# Patient Record
Sex: Female | Born: 1965 | Race: White | Hispanic: No | Marital: Married | Smoking: Never smoker
Health system: Southern US, Community
[De-identification: ages and names within clinical notes are randomized; demographics above are authoritative.]

---

## 2014-08-06 ENCOUNTER — Ambulatory Visit: Payer: Self-pay | Admitting: Family Medicine

## 2017-01-09 ENCOUNTER — Other Ambulatory Visit: Payer: Self-pay | Admitting: Family Medicine

## 2017-01-09 DIAGNOSIS — Z1231 Encounter for screening mammogram for malignant neoplasm of breast: Secondary | ICD-10-CM

## 2017-02-02 ENCOUNTER — Encounter: Payer: Self-pay | Admitting: Radiology

## 2017-02-02 ENCOUNTER — Ambulatory Visit
Admission: RE | Admit: 2017-02-02 | Discharge: 2017-02-02 | Disposition: A | Discharge status: Home / Self Care | Payer: BLUE CROSS/BLUE SHIELD | Source: Ambulatory Visit | Attending: Family Medicine | Admitting: Family Medicine

## 2017-02-02 DIAGNOSIS — Z1231 Encounter for screening mammogram for malignant neoplasm of breast: Secondary | ICD-10-CM | POA: Diagnosis present

## 2017-02-13 ENCOUNTER — Inpatient Hospital Stay
Admission: RE | Admit: 2017-02-13 | Discharge: 2017-02-13 | Disposition: A | Discharge status: Home / Self Care | Payer: Self-pay | Source: Ambulatory Visit | Attending: *Deleted | Admitting: *Deleted

## 2017-02-13 ENCOUNTER — Other Ambulatory Visit: Payer: Self-pay | Admitting: *Deleted

## 2017-02-13 DIAGNOSIS — Z9289 Personal history of other medical treatment: Secondary | ICD-10-CM

## 2018-01-23 ENCOUNTER — Other Ambulatory Visit: Payer: Self-pay | Admitting: Family Medicine

## 2018-01-23 DIAGNOSIS — Z1231 Encounter for screening mammogram for malignant neoplasm of breast: Secondary | ICD-10-CM

## 2018-02-13 ENCOUNTER — Ambulatory Visit
Admission: RE | Admit: 2018-02-13 | Discharge: 2018-02-13 | Disposition: A | Discharge status: Home / Self Care | Payer: BLUE CROSS/BLUE SHIELD | Source: Ambulatory Visit | Attending: Family Medicine | Admitting: Family Medicine

## 2018-02-13 DIAGNOSIS — Z1231 Encounter for screening mammogram for malignant neoplasm of breast: Secondary | ICD-10-CM | POA: Insufficient documentation

## 2019-01-21 ENCOUNTER — Other Ambulatory Visit: Payer: Self-pay | Admitting: Family Medicine

## 2019-01-21 DIAGNOSIS — Z1231 Encounter for screening mammogram for malignant neoplasm of breast: Secondary | ICD-10-CM

## 2019-02-14 ENCOUNTER — Encounter: Payer: Self-pay | Admitting: Radiology

## 2019-02-14 ENCOUNTER — Ambulatory Visit
Admission: RE | Admit: 2019-02-14 | Discharge: 2019-02-14 | Disposition: A | Discharge status: Home / Self Care | Payer: BLUE CROSS/BLUE SHIELD | Source: Ambulatory Visit | Attending: Family Medicine | Admitting: Family Medicine

## 2019-02-14 DIAGNOSIS — Z1231 Encounter for screening mammogram for malignant neoplasm of breast: Secondary | ICD-10-CM | POA: Insufficient documentation

## 2019-02-18 ENCOUNTER — Other Ambulatory Visit: Payer: Self-pay | Admitting: Family Medicine

## 2019-02-18 DIAGNOSIS — R921 Mammographic calcification found on diagnostic imaging of breast: Secondary | ICD-10-CM

## 2019-02-18 DIAGNOSIS — R928 Other abnormal and inconclusive findings on diagnostic imaging of breast: Secondary | ICD-10-CM

## 2019-02-24 ENCOUNTER — Ambulatory Visit
Admission: RE | Admit: 2019-02-24 | Discharge: 2019-02-24 | Disposition: A | Discharge status: Home / Self Care | Payer: BLUE CROSS/BLUE SHIELD | Source: Ambulatory Visit | Attending: Family Medicine | Admitting: Family Medicine

## 2019-02-24 DIAGNOSIS — R921 Mammographic calcification found on diagnostic imaging of breast: Secondary | ICD-10-CM | POA: Diagnosis present

## 2019-02-24 DIAGNOSIS — R928 Other abnormal and inconclusive findings on diagnostic imaging of breast: Secondary | ICD-10-CM | POA: Insufficient documentation

## 2019-02-25 ENCOUNTER — Other Ambulatory Visit: Payer: Self-pay | Admitting: Family Medicine

## 2019-02-25 DIAGNOSIS — R921 Mammographic calcification found on diagnostic imaging of breast: Secondary | ICD-10-CM

## 2019-04-18 DIAGNOSIS — N951 Menopausal and female climacteric states: Secondary | ICD-10-CM | POA: Insufficient documentation

## 2019-10-03 ENCOUNTER — Other Ambulatory Visit: Payer: Self-pay

## 2019-10-03 ENCOUNTER — Ambulatory Visit: Payer: Self-pay

## 2019-10-03 DIAGNOSIS — Z23 Encounter for immunization: Secondary | ICD-10-CM

## 2020-02-12 ENCOUNTER — Ambulatory Visit: Payer: BLUE CROSS/BLUE SHIELD

## 2020-03-16 ENCOUNTER — Other Ambulatory Visit: Payer: Self-pay | Admitting: Family Medicine

## 2020-03-16 DIAGNOSIS — R920 Mammographic microcalcification found on diagnostic imaging of breast: Secondary | ICD-10-CM

## 2020-05-10 ENCOUNTER — Ambulatory Visit
Admission: RE | Admit: 2020-05-10 | Discharge: 2020-05-10 | Disposition: A | Discharge status: Home / Self Care | Payer: BC Managed Care – PPO | Source: Ambulatory Visit | Attending: Family Medicine | Admitting: Family Medicine

## 2020-05-10 DIAGNOSIS — R920 Mammographic microcalcification found on diagnostic imaging of breast: Secondary | ICD-10-CM | POA: Diagnosis not present

## 2020-06-16 ENCOUNTER — Other Ambulatory Visit: Payer: Self-pay

## 2020-06-16 ENCOUNTER — Ambulatory Visit: Payer: Self-pay

## 2020-06-16 ENCOUNTER — Ambulatory Visit: Payer: BC Managed Care – PPO | Admitting: Family Medicine

## 2020-06-16 ENCOUNTER — Encounter: Payer: Self-pay | Admitting: Family Medicine

## 2020-06-16 DIAGNOSIS — M79671 Pain in right foot: Secondary | ICD-10-CM | POA: Diagnosis not present

## 2020-06-16 NOTE — Progress Notes (Signed)
Office Visit Note   Patient: Penny Burton           Date of Birth: January 14, 1966           MRN: [id number] Visit Date: 06/16/2020 Requested by: [facility] [address] [place],  [place] [address] PCP: [facility]  Subjective: Chief Complaint  Patient presents with  . Right Foot - Pain    Pain top of foot. [facility] Day weekend, when she stepped on uneven pavement. Had bruising around her ankle and up her lower leg initially. That has gone away, but pain has remained in top of foot. Has some tingling anterior ankle.     HPI: She is here with right foot pain.  About a month ago she tripped and fell forward landing on her face.  She sustained an abrasion on her chin, and on her hands and she substantially bruised her right lower leg and foot.  She was able to get up and walk with a limp.  She did not seek immediate treatment.  The next week she went to look at colleges with her daughter and did okay, but was still limping quite a bit.  The bruising has resolved and she is not having any pain in her shin area, but she continues to have pain on the dorsum of her foot as well as occasional tingling sensation in front of her ankle.  She has had bad ankle sprains in the past which felt different than this.  She is otherwise in good health.                ROS:   All other systems were reviewed and are negative.  Objective: Vital Signs: There were no vitals taken for this visit.  Physical Exam:  General:  Alert and oriented, in no acute distress. Pulm:  Breathing unlabored. Psy:  Normal mood, congruent affect. Skin: There is no bruising.  She has slight soft tissue swelling dorsally over the distal second and third metatarsals. Right foot: There is no tenderness around the ankle joint, ligaments feel stable.  2+ dorsalis pedis pulse.  Normal sensation.  She has a little bit of pain with extension of the toes against resistance.  No pain with eversion of the  foot or plantarflexion against resistance and her strength is normal.  She is tender to palpation at the mid to distal second and third metatarsals.  No crepitation or step-off.    Imaging: XR Foot Complete Right  Result Date: 06/16/2020 X-rays of the right foot reveal normal anatomic alignment, no significant degenerative change, no sign of fracture.   Assessment & Plan: 1. 1 month status post fall with continued right foot pain, possibilities include bone contusion of metatarsals, nerve stretch injury. -Activities as tolerated, using pain as guide. Anticipate that in several weeks she should be pretty much pain-free. If she continues to have pain, we may do 1 more x-ray and if unrevealing, MRI scan.     Procedures: No procedures performed  No notes on file     PMFS History: There are no problems to display for this patient.  History reviewed. No pertinent past medical history.  Family History  Problem Relation Age of Onset  . Breast cancer Maternal Aunt 45    History reviewed. No pertinent surgical history. Social History   Occupational History  . Not on file  Tobacco Use  . Smoking status: Not on file  Substance and Sexual Activity  .  Alcohol use: Not on file  . Drug use: Not on file  . Sexual activity: Not on file

## 2021-04-28 ENCOUNTER — Other Ambulatory Visit: Payer: Self-pay | Admitting: Family Medicine

## 2021-08-16 ENCOUNTER — Other Ambulatory Visit: Payer: Self-pay

## 2021-08-16 DIAGNOSIS — Z1231 Encounter for screening mammogram for malignant neoplasm of breast: Secondary | ICD-10-CM

## 2021-09-02 ENCOUNTER — Ambulatory Visit
Admission: RE | Admit: 2021-09-02 | Discharge: 2021-09-02 | Disposition: A | Discharge status: Home / Self Care | Payer: BC Managed Care – PPO | Source: Ambulatory Visit

## 2021-09-02 ENCOUNTER — Other Ambulatory Visit: Payer: Self-pay

## 2021-09-02 DIAGNOSIS — Z1231 Encounter for screening mammogram for malignant neoplasm of breast: Secondary | ICD-10-CM | POA: Diagnosis present

## 2021-11-07 ENCOUNTER — Other Ambulatory Visit: Payer: Self-pay

## 2021-11-07 DIAGNOSIS — Z1211 Encounter for screening for malignant neoplasm of colon: Secondary | ICD-10-CM

## 2021-11-07 MED ORDER — NA SULFATE-K SULFATE-MG SULF 17.5-3.13-1.6 GM/177ML PO SOLN: 1.0000 | Freq: Once | ORAL | 0 refills | Status: AC

## 2021-11-07 NOTE — Progress Notes (Signed)
Gastroenterology Pre-Procedure Review  Request Date: 11/29/21 Requesting Physician: Dr. Servando Snare  PATIENT REVIEW QUESTIONS: The patient responded to the following health history questions as indicated:    1. Are you having any GI issues? no 2. Do you have a personal history of Polyps? no 3. Do you have a family history of Colon Cancer or Polyps? no 4. Diabetes Mellitus? no 5. Joint replacements in the past 12 months?no 6. Major health problems in the past 3 months?no 7. Any artificial heart valves, MVP, or defibrillator?no    MEDICATIONS & ALLERGIES:    Patient reports the following regarding taking any anticoagulation/antiplatelet therapy:   Plavix, Coumadin, Eliquis, Xarelto, Lovenox, Pradaxa, Brilinta, or Effient? no Aspirin? no  Patient confirms/reports the following medications:  Current Outpatient Medications  Medication Sig Dispense Refill   Ascorbic Acid (VITAMIN C) 1000 MG tablet Take 500 mg by mouth daily. 2 daily     Cholecalciferol (VITAMIN D3 PO) Take by mouth daily.     estradiol (ESTRACE) 0.1 MG/GM vaginal cream INSERT 2 GRAMS VAGINALLY EVERY NIGHT FOR 2 WEEKS THEN USE VAGINALLY 2 TIMES EVERY WEEK THEREAFTER     Multiple Vitamins-Minerals (MULTIVITAMIN GUMMIES ADULT PO) Take by mouth daily.     No current facility-administered medications for this visit.    Patient confirms/reports the following allergies:  No Known Allergies  No orders of the defined types were placed in this encounter.   AUTHORIZATION INFORMATION Primary Insurance: 1D#: Group #:  Secondary Insurance: 1D#: Group #:  SCHEDULE INFORMATION: Date: 11/29/21 Time: Location: [facility]

## 2021-11-29 ENCOUNTER — Ambulatory Visit: Payer: BC Managed Care – PPO | Admitting: Anesthesiology

## 2021-11-29 ENCOUNTER — Encounter: Payer: Self-pay | Admitting: Gastroenterology

## 2021-11-29 ENCOUNTER — Ambulatory Visit
Admission: RE | Admit: 2021-11-29 | Discharge: 2021-11-29 | Disposition: A | Discharge status: Home / Self Care | Payer: BC Managed Care – PPO | Attending: Gastroenterology | Admitting: Gastroenterology

## 2021-11-29 ENCOUNTER — Encounter
Admission: RE | Disposition: A | Discharge status: Home / Self Care | Payer: Self-pay | Source: Home / Self Care | Attending: Gastroenterology

## 2021-11-29 DIAGNOSIS — Z1211 Encounter for screening for malignant neoplasm of colon: Secondary | ICD-10-CM | POA: Diagnosis present

## 2021-11-29 DIAGNOSIS — K640 First degree hemorrhoids: Secondary | ICD-10-CM | POA: Insufficient documentation

## 2021-11-29 HISTORY — PX: COLONOSCOPY WITH PROPOFOL: SHX5780

## 2021-11-29 SURGERY — COLONOSCOPY WITH PROPOFOL
Anesthesia: General

## 2021-11-29 MED ORDER — LIDOCAINE HCL (CARDIAC) PF 100 MG/5ML IV SOSY
PREFILLED_SYRINGE | INTRAVENOUS | Status: DC | PRN
  Administered 2021-11-29: 40 mg via INTRAVENOUS

## 2021-11-29 MED ORDER — SODIUM CHLORIDE 0.9 % IV SOLN: INTRAVENOUS | Status: DC

## 2021-11-29 MED ORDER — PROPOFOL 500 MG/50ML IV EMUL
INTRAVENOUS | Status: DC | PRN
  Administered 2021-11-29: 150 ug/kg/min via INTRAVENOUS

## 2021-11-29 MED ORDER — PROPOFOL 10 MG/ML IV BOLUS
INTRAVENOUS | Status: DC | PRN
  Administered 2021-11-29: 80 mg via INTRAVENOUS

## 2021-11-29 NOTE — Anesthesia Procedure Notes (Signed)
Date/Time: 11/29/2021 10:30 AM Performed by: Stormy Fabian, CRNA Pre-anesthesia Checklist: Patient identified, Emergency Drugs available, Suction available and Patient being monitored Patient Re-evaluated:Patient Re-evaluated prior to induction Oxygen Delivery Method: Nasal cannula Induction Type: IV induction Dental Injury: Teeth and Oropharynx as per pre-operative assessment  Comments: Nasal cannula with etCO2 monitoring

## 2021-11-29 NOTE — Anesthesia Preprocedure Evaluation (Addendum)
Anesthesia Evaluation  Patient identified by MRN, date of birth, ID band Patient awake    Reviewed: Allergy & Precautions, H&P , NPO status , Patient's Chart, lab work & pertinent test results  Airway Mallampati: II  TM Distance: >3 FB Neck ROM: Full    Dental no notable dental hx.    Pulmonary neg pulmonary ROS,    Pulmonary exam normal breath sounds clear to auscultation       Cardiovascular Exercise Tolerance: Good negative cardio ROS Normal cardiovascular exam Rhythm:Regular Rate:Normal     Neuro/Psych negative neurological ROS  negative psych ROS   GI/Hepatic negative GI ROS, Neg liver ROS,   Endo/Other  negative endocrine ROS  Renal/GU negative Renal ROS  negative genitourinary   Musculoskeletal negative musculoskeletal ROS (+)   Abdominal Normal abdominal exam  (+)   Peds negative pediatric ROS (+)  Hematology negative hematology ROS (+)   Anesthesia Other Findings   Reproductive/Obstetrics negative OB ROS                            Anesthesia Physical Anesthesia Plan  ASA: 1  Anesthesia Plan: General   Post-op Pain Management:    Induction: Intravenous  PONV Risk Score and Plan:   Airway Management Planned: Natural Airway and Nasal Cannula  Additional Equipment:   Intra-op Plan:   Post-operative Plan:   Informed Consent: I have reviewed the patients History and Physical, chart, labs and discussed the procedure including the risks, benefits and alternatives for the proposed anesthesia with the patient or authorized representative who has indicated his/her understanding and acceptance.     Dental Advisory Given  Plan Discussed with: Anesthesiologist, CRNA and Surgeon  Anesthesia Plan Comments: (Patient consented for risks of anesthesia including but not limited to:  - adverse reactions to medications - risk of airway placement if required - damage to eyes,  teeth, lips or other oral mucosa - nerve damage due to positioning  - sore throat or hoarseness - Damage to heart, brain, nerves, lungs, other parts of body or loss of life  Patient voiced understanding.)       Anesthesia Quick Evaluation

## 2021-11-29 NOTE — Anesthesia Postprocedure Evaluation (Signed)
Anesthesia Post Note  Patient: Penny Burton  Procedure(s) Performed: COLONOSCOPY WITH PROPOFOL  Patient location during evaluation: PACU Anesthesia Type: General Level of consciousness: awake and alert Pain management: pain level controlled Vital Signs Assessment: post-procedure vital signs reviewed and stable Respiratory status: spontaneous breathing, nonlabored ventilation and respiratory function stable Cardiovascular status: blood pressure returned to baseline and stable Postop Assessment: no apparent nausea or vomiting Anesthetic complications: no   No notable events documented.   Last Vitals:  Vitals:   11/29/21 1050 11/29/21 1100  BP: 111/76 122/80  Pulse: 62 (!) 57  Resp: 16 15  Temp:    SpO2: 100% 100%    Last Pain:  Vitals:   11/29/21 0936  TempSrc: Temporal  PainSc: 0-No pain                 Foye Deer

## 2021-11-29 NOTE — Op Note (Signed)
[facility] Gastroenterology Patient Name: Penny Burton Procedure Date: 11/29/2021 10:26 AM MRN: [id number] Account #: [id number] Date of Birth: [birth year] Admit Type: Outpatient Age: 55 Room: [facility] ENDO ROOM 4 Gender: Female Note Status: Finalized Instrument Name: Prentice Docker [id number] Procedure:             Colonoscopy Indications:           Screening for colorectal malignant neoplasm Providers:             Midge Minium MD, MD Referring MD:          [facility] (Referring MD) Medicines:             Propofol per Anesthesia Complications:         No immediate complications. Procedure:             Pre-Anesthesia Assessment:                        - Prior to the procedure, a History and Physical was                         performed, and patient medications and allergies were                         reviewed. The patient's tolerance of previous                         anesthesia was also reviewed. The risks and benefits                         of the procedure and the sedation options and risks                         were discussed with the patient. All questions were                         answered, and informed consent was obtained. Prior                         Anticoagulants: The patient has taken no previous                         anticoagulant or antiplatelet agents. ASA Grade                         Assessment: II - A patient with mild systemic disease.                         After reviewing the risks and benefits, the patient                         was deemed in satisfactory condition to undergo the                         procedure.                        After obtaining informed consent, the colonoscope was  passed under direct vision. Throughout the procedure,                         the patient's blood pressure, pulse, and oxygen                         saturations were monitored continuously. The                          Colonoscope was introduced through the anus and                         advanced to the the cecum, identified by appendiceal                         orifice and ileocecal valve. The colonoscopy was                         performed without difficulty. The patient tolerated                         the procedure well. The quality of the bowel                         preparation was adequate to identify polyps. Findings:      The perianal and digital rectal examinations were normal.      Non-bleeding internal hemorrhoids were found during retroflexion. The       hemorrhoids were Grade I (internal hemorrhoids that do not prolapse). Impression:            - Non-bleeding internal hemorrhoids.                        - No specimens collected. Recommendation:        - Discharge patient to home.                        - Resume previous diet.                        - Continue present medications.                        - Repeat colonoscopy in 10 years for screening                         purposes. Procedure Code(s):     --- Professional ---                        [phone], Colonoscopy, flexible; diagnostic, including                         collection of specimen(s) by brushing or washing, when                         performed (separate procedure) Diagnosis Code(s):     --- Professional ---                        Z12.11, Encounter for screening for malignant neoplasm  of colon CPT copyright 2019 American Medical Association. All rights reserved. The codes documented in this report are preliminary and upon coder review may  be revised to meet current compliance requirements. Midge Minium MD, MD 11/29/2021 10:47:24 AM This report has been signed electronically. Number of Addenda: 0 Note Initiated On: 11/29/2021 10:26 AM Scope Withdrawal Time: 0 hours 7 minutes 41 seconds  Total Procedure Duration: 0 hours 14 minutes 44 seconds  Estimated Blood Loss:  Estimated blood  loss: none.      [facility]

## 2021-11-29 NOTE — H&P (Signed)
° °  Midge Minium, MD [facility] [address] [place], [place] [address] Phone: [phone] Fax : [phone]  Primary Care Physician:  [facility] Primary Gastroenterologist:  Dr. Servando Snare  Pre-Procedure History & Physical: HPI:  Penny Burton is a 55 y.o. female is here for a screening colonoscopy.   History reviewed. No pertinent past medical history.  Past Surgical History:  Procedure Laterality Date   CESAREAN SECTION     x2    Prior to Admission medications   Medication Sig Start Date End Date Taking? Authorizing Provider  Ascorbic Acid (VITAMIN C) 1000 MG tablet Take 500 mg by mouth daily. 2 daily   Yes [provider]  Cholecalciferol (VITAMIN D3 PO) Take by mouth daily.   Yes [provider]  Multiple Vitamins-Minerals (MULTIVITAMIN GUMMIES ADULT PO) Take by mouth daily.   Yes [provider]  estradiol (ESTRACE) 0.1 MG/GM vaginal cream INSERT 2 GRAMS VAGINALLY EVERY NIGHT FOR 2 WEEKS THEN USE VAGINALLY 2 TIMES EVERY WEEK THEREAFTER 12/30/19   [provider]    Allergies as of 11/07/2021   (No Known Allergies)    Family History  Problem Relation Age of Onset   Breast cancer Maternal Aunt 62    Social History   Socioeconomic History   Marital status: Married    Spouse name: Not on file   Number of children: Not on file   Years of education: Not on file   Highest education level: Not on file  Occupational History   Not on file  Tobacco Use   Smoking status: Never   Smokeless tobacco: Never  Substance and Sexual Activity   Alcohol use: Yes    Comment: occasionally   Drug use: Never   Sexual activity: Yes  Other Topics Concern   Not on file  Social History Narrative   Not on file   Social Determinants of Health   Financial Resource Strain: Not on file  Food Insecurity: Not on file  Transportation Needs: Not on file  Physical Activity: Not on file  Stress: Not on file  Social Connections: Not on  file  Intimate Partner Violence: Not on file    Review of Systems: See HPI, otherwise negative ROS  Physical Exam: BP (!) 126/97    Pulse 91    Temp (!) 96.8 F (36 C) (Temporal)    Resp 16    Ht 5\' 2"  (1.575 m)    Wt 73 kg    SpO2 100%    BMI 29.45 kg/m  General:   Alert,  pleasant and cooperative in NAD Head:  Normocephalic and atraumatic. Neck:  Supple; no masses or thyromegaly. Lungs:  Clear throughout to auscultation.    Heart:  Regular rate and rhythm. Abdomen:  Soft, nontender and nondistended. Normal bowel sounds, without guarding, and without rebound.   Neurologic:  Alert and  oriented x4;  grossly normal neurologically.  Impression/Plan: Penny Burton is now here to undergo a screening colonoscopy.  Risks, benefits, and alternatives regarding colonoscopy have been reviewed with the patient.  Questions have been answered.  All parties agreeable.

## 2021-11-29 NOTE — Transfer of Care (Signed)
Immediate Anesthesia Transfer of Care Note  Patient: Penny Burton  Procedure(s) Performed: Procedure(s): COLONOSCOPY WITH PROPOFOL (N/A)  Patient Location: PACU and Endoscopy Unit  Anesthesia Type:General  Level of Consciousness: sedated  Airway & Oxygen Therapy: Patient Spontanous Breathing and Patient connected to nasal cannula oxygen  Post-op Assessment: Report given to RN and Post -op Vital signs reviewed and stable  Post vital signs: Reviewed and stable  Last Vitals:  Vitals:   11/29/21 0936 11/29/21 1050  BP: (!) 126/97 111/76  Pulse: 91 62  Resp: 16 16  Temp: (!) 36 C   SpO2: 100% 100%    Complications: No apparent anesthesia complications

## 2021-11-30 ENCOUNTER — Encounter: Payer: Self-pay | Admitting: Gastroenterology

## 2022-04-30 IMAGING — MG MM DIGITAL SCREENING BILAT W/ TOMO AND CAD
6 of 10 series · 6 of 30 positions shown · non-contrast
Comparison: Previous exam(s).

CLINICAL DATA: Screening.

EXAM:
DIGITAL SCREENING BILATERAL MAMMOGRAM WITH TOMOSYNTHESIS AND CAD
TECHNIQUE: Bilateral screening digital craniocaudal and mediolateral oblique
mammograms were obtained. Bilateral screening digital breast
tomosynthesis was performed. The images were evaluated with
computer-aided detection.

[R MLO synth-2D (1 of 2)]
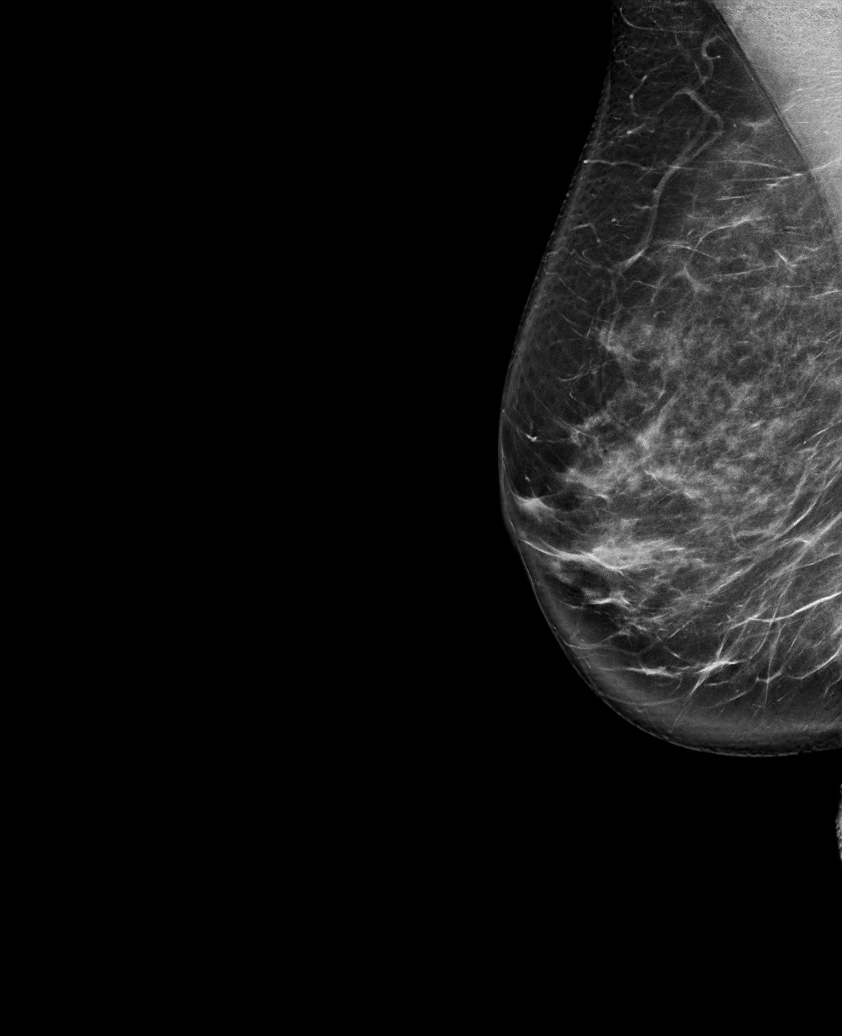

[R CC synth-2D]
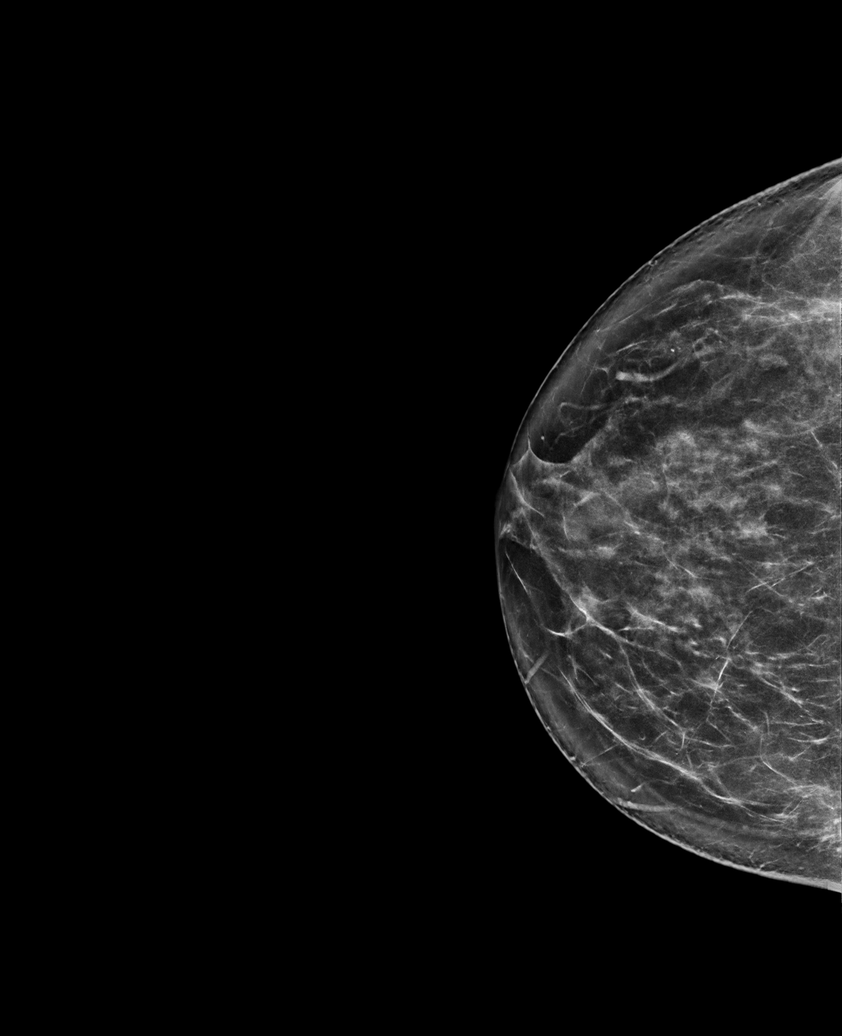

[L CC synth-2D]
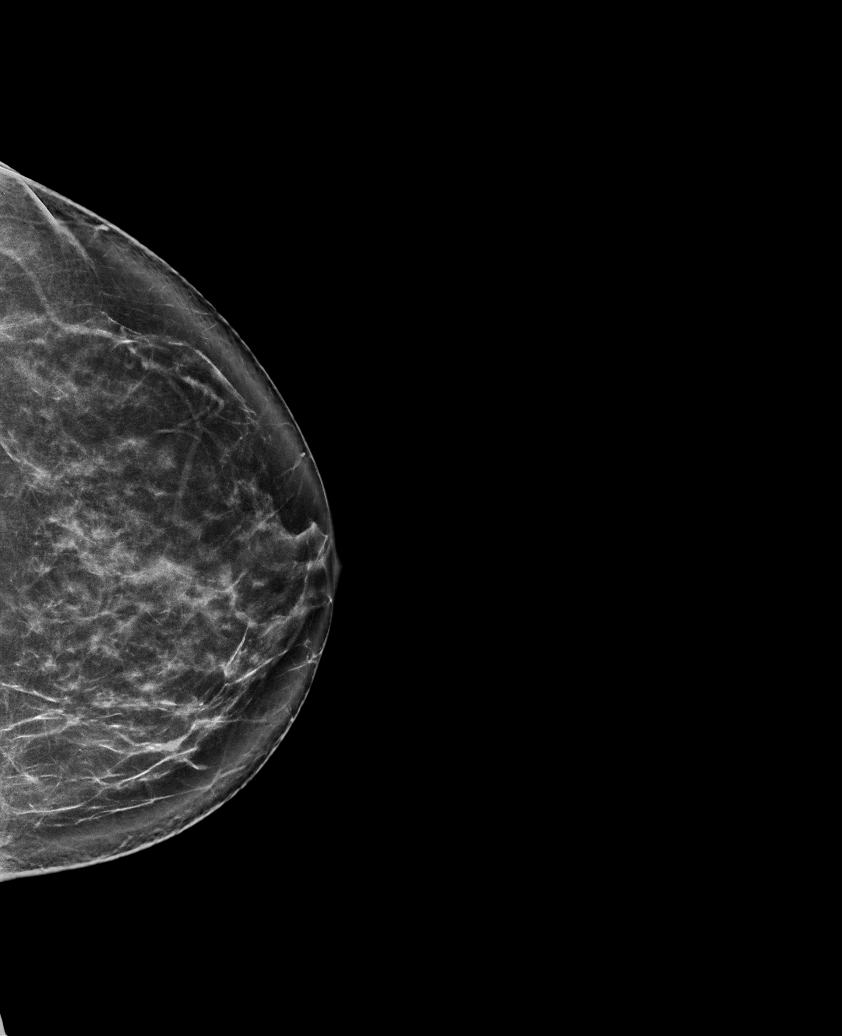

[L MLO synth-2D]
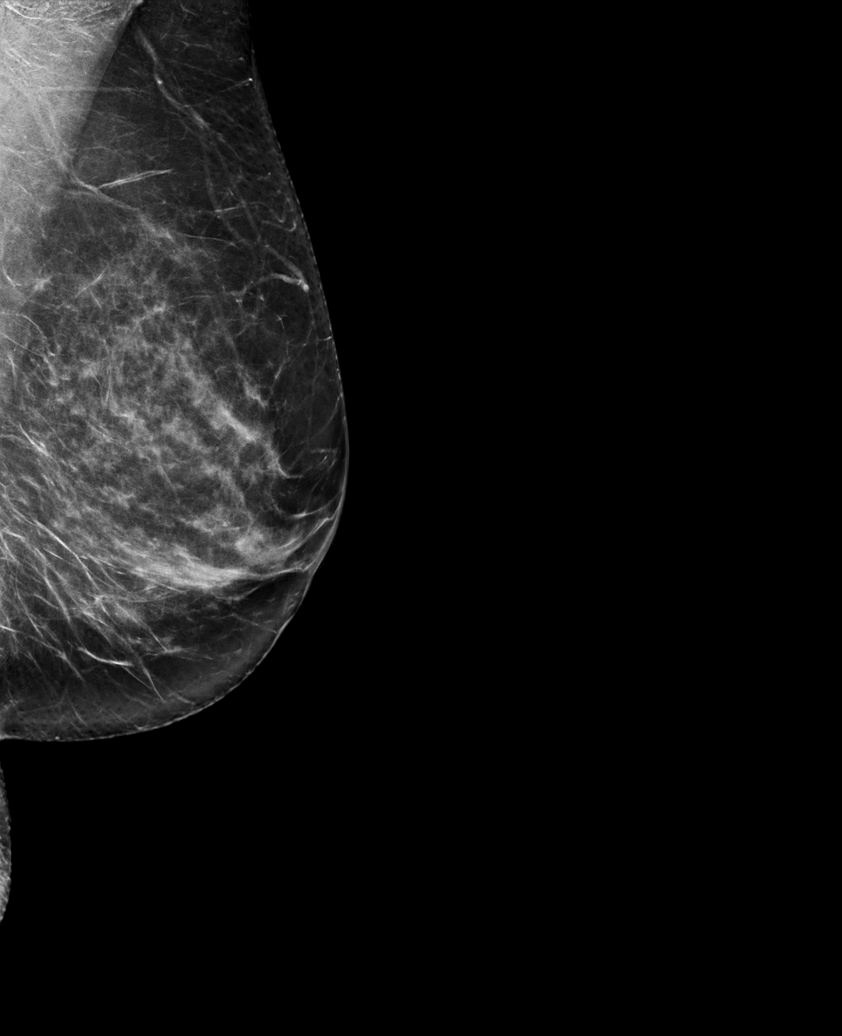

[R MLO synth-2D (2 of 2)]
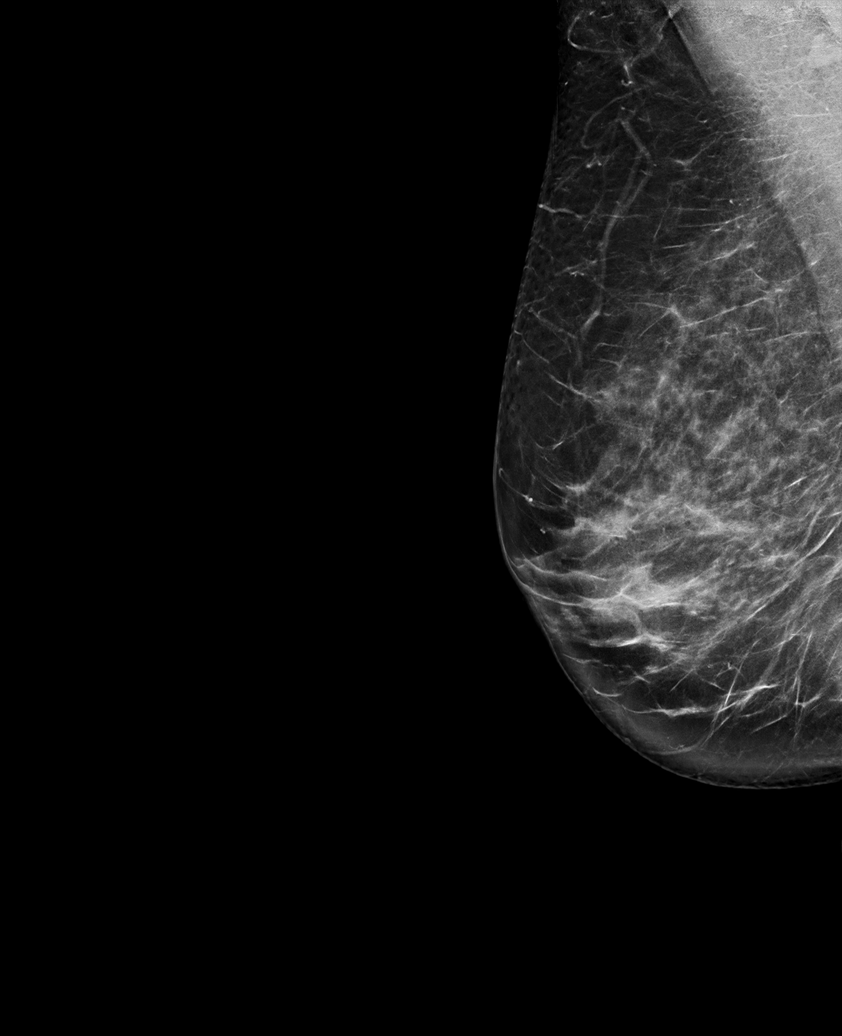

[R MLO tomo · tomo slice 43/86.0]
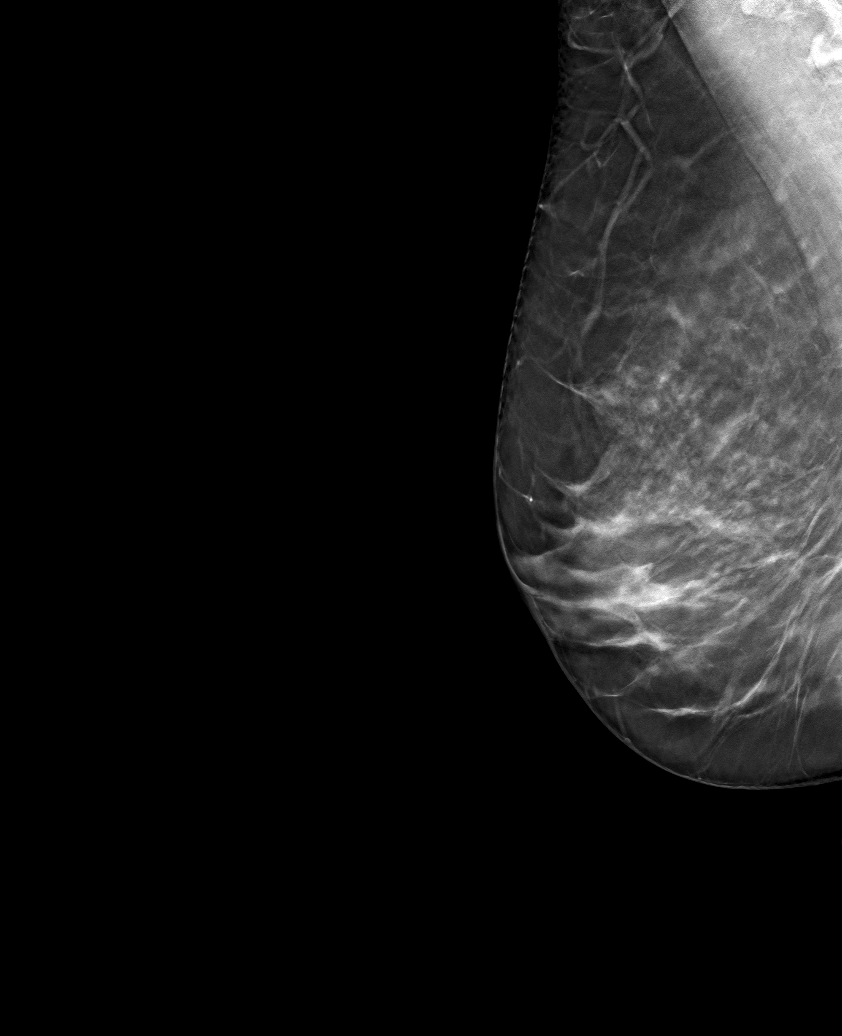

[6 of 30 positions shown; findings below may reference images not displayed]

ACR Breast Density Category c: The breast tissue is heterogeneously
dense, which may obscure small masses.
FINDINGS: There are no findings suspicious for malignancy.
IMPRESSION: No mammographic evidence of malignancy. A result letter of this
screening mammogram will be mailed directly to the patient.

RECOMMENDATION:
Screening mammogram in one year. (Code:[id number])

BI-RADS CATEGORY  1: Negative.

## 2022-11-07 ENCOUNTER — Ambulatory Visit (INDEPENDENT_AMBULATORY_CARE_PROVIDER_SITE_OTHER): Payer: Self-pay | Admitting: Nurse Practitioner

## 2022-11-07 DIAGNOSIS — Z20828 Contact with and (suspected) exposure to other viral communicable diseases: Secondary | ICD-10-CM

## 2022-11-07 MED ORDER — OSELTAMIVIR PHOSPHATE 75 MG PO CAPS
75.0000 mg | ORAL_CAPSULE | Freq: Two times a day (BID) | ORAL | 0 refills | Status: AC
Start: 2022-11-07 — End: 2022-11-12

## 2022-11-07 NOTE — Progress Notes (Signed)
  Virtual Visit Consent   Penny Burton, you are scheduled for a virtual visit with a [facility] provider today. Just as with appointments in the office, your consent must be obtained to participate.   I need to obtain your verbal consent now. Are you willing to proceed with your visit today? Penny Burton has provided verbal consent on 11/07/2022 for a virtual visit (video or telephone). Penny Simas, FNP  Date: 11/07/2022 1:27 PM  Virtual Visit via Video Note   I, Penny Burton, connected with  [name]  ([id number], 1966/12/10) on 11/07/22 at  1:30 PM EST by telephone and verified that I am speaking with the correct person using two identifiers.  Location: Patient: Virtual Visit Location Patient: Home Provider: Virtual Visit Location Provider: Office/Clinic   I discussed the limitations of evaluation and management by telemedicine and the availability of in person appointments. The patient expressed understanding and agreed to proceed.    History of Present Illness: Penny Burton is a 56 y.o. and is being seen today for concern after being exposed to her daughter who was diagnosed with the flu.  She was in the car for 2 hours with her daughter while she was sick  Today patient has a scratchy throat and possible start of symptoms   She has had her annual flu vaccine in October of this year   Problems:  Patient Active Problem List   Diagnosis Date Noted   Colon cancer screening    Vaginal dryness, menopausal 04/18/2019    Allergies: No Known Allergies Medications:  Current Outpatient Medications:    Ascorbic Acid (VITAMIN C) 1000 MG tablet, Take 500 mg by mouth daily. 2 daily, Disp: , Rfl:    Cholecalciferol (VITAMIN D3 PO), Take by mouth daily., Disp: , Rfl:    estradiol (ESTRACE) 0.1 MG/GM vaginal cream, INSERT 2 GRAMS VAGINALLY EVERY NIGHT FOR 2 WEEKS THEN USE VAGINALLY 2 TIMES EVERY WEEK THEREAFTER, Disp: , Rfl:    Multiple Vitamins-Minerals (MULTIVITAMIN GUMMIES ADULT  PO), Take by mouth daily., Disp: , Rfl:   Observations/Objective: No labored breathing.  Speech is clear and coherent with logical content.  Patient is alert and oriented at baseline.    Assessment and Plan: 1. Exposure to the flu Manage symptoms with OTC cold/flu medications Take anti-viral with food and stop if symptoms worsen   - oseltamivir (TAMIFLU) 75 MG capsule; Take 1 capsule (75 mg total) by mouth 2 (two) times daily for 5 days.  Dispense: 10 capsule; Refill: 0     Follow Up Instructions: I discussed the assessment and treatment plan with the patient. The patient was provided an opportunity to ask questions and all were answered. The patient agreed with the plan and demonstrated an understanding of the instructions.    The patient was advised to call back or seek an in-person evaluation if the symptoms worsen or if the condition fails to improve as anticipated.  Time:  I spent 15 minutes with the patient via telehealth technology discussing the above problems/concerns.    Penny Simas, FNP

## 2022-11-17 ENCOUNTER — Emergency Department (HOSPITAL_COMMUNITY): Payer: BC Managed Care – PPO

## 2022-11-17 ENCOUNTER — Emergency Department (HOSPITAL_COMMUNITY)
Admission: EM | Admit: 2022-11-17 | Discharge: 2022-11-17 | Disposition: A | Discharge status: Home / Self Care | Payer: BC Managed Care – PPO | Attending: Emergency Medicine | Admitting: Emergency Medicine

## 2022-11-17 ENCOUNTER — Other Ambulatory Visit: Payer: Self-pay

## 2022-11-17 DIAGNOSIS — R911 Solitary pulmonary nodule: Secondary | ICD-10-CM | POA: Diagnosis not present

## 2022-11-17 DIAGNOSIS — R0602 Shortness of breath: Secondary | ICD-10-CM

## 2022-11-17 DIAGNOSIS — R079 Chest pain, unspecified: Secondary | ICD-10-CM | POA: Diagnosis present

## 2022-11-17 LAB — BASIC METABOLIC PANEL
Anion gap: 6 (ref 5–15)
BUN: 14 mg/dL (ref 6–20)
CO2: 28 mmol/L (ref 22–32)
Calcium: 9.2 mg/dL (ref 8.9–10.3)
Chloride: 107 mmol/L (ref 98–111)
Creatinine, Ser: 0.78 mg/dL (ref 0.44–1.00)
GFR, Estimated: >60 mL/min (ref >60)
Glucose, Bld: 101 mg/dL — ABNORMAL HIGH (ref 70–99)
Potassium: 4.2 mmol/L (ref 3.5–5.1)
Sodium: 141 mmol/L (ref 135–145)

## 2022-11-17 LAB — CBC
HCT: 38.9 % (ref 36.0–46.0)
Hemoglobin: 12.9 g/dL (ref 12.0–15.0)
MCH: 28.5 pg (ref 26.0–34.0)
MCHC: 33.2 g/dL (ref 30.0–36.0)
MCV: 86.1 fL (ref 80.0–100.0)
Platelets: 231 10*3/uL (ref 150–400)
RBC: 4.52 MIL/uL (ref 3.87–5.11)
RDW: 13.2 % (ref 11.5–15.5)
WBC: 5 10*3/uL (ref 4.0–10.5)
nRBC: 0 % (ref 0.0–0.2)

## 2022-11-17 LAB — TROPONIN I (HIGH SENSITIVITY): Troponin I (High Sensitivity): <2 ng/L (ref <18)

## 2022-11-17 NOTE — ED Provider Triage Note (Signed)
Emergency Medicine Provider Triage Evaluation Note  Penny Burton , a 56 y.o. female  was evaluated in triage.  Pt complains of sudden onset of chest pain with shortness of breath over the last 2 to 3 days.  Initially was on the left side of her chest, nonradiating, however the next day felt more pain tenderness in her neck.  Today pain/discomfort has returned to the left central chest.  Occurs with deep inspiration, though not with overall inspiration.  No history of this before, asthma or lung disease, or significant cardiac history.  Works with administrative coordination at the [employer], and has noted increased stress over the last few weeks.  Recently finished taking Tamiflu last week on Thursday due to positive flu test.  Review of Systems  Positive:  Negative: See above  Physical Exam  BP 124/87 (BP Location: Left Arm)   Pulse 73   Temp 98.9 F (37.2 C) (Oral)   Resp 16   Ht 5\' 2"  (1.575 m)   Wt 74.8 kg   SpO2 99%   BMI 30.18 kg/m  Gen:   Awake, no distress   Resp:  Normal effort, CTAB, equal chest rise MSK:   Moves extremities without difficulty  Other:  Sitting comfortably, mild chest wall tenderness of left central chest.  Medical Decision Making  Medically screening exam initiated at 1:19 PM.  Appropriate orders placed.  Ardella Chhim was informed that the remainder of the evaluation will be completed by another provider, this initial triage assessment does not replace that evaluation, and the importance of remaining in the ED until their evaluation is complete.     Conception Chancy, PA-C 11/17/22 1322

## 2022-11-17 NOTE — ED Notes (Signed)
Dr Chilton Si and Blue top sent to lab.

## 2022-11-17 NOTE — Discharge Instructions (Addendum)
Your caregiver has diagnosed you as having chest pain that is nonspecific for one problem.  This means that after looking at you and examining you and ordering tests (such as blood work, chest x-rays, CT scan, and EKG), your caregiver does not believe that the problem is serious enough to need watching in the hospital.  This judgment is often made after testing shows no acute heart attack and you are at low risk for sudden acute heart condition.  Chest pain comes from many different causes.    Seek immediate medical attention if:  You have severe chest pain, especially if the pain is crushing or pressure-like and spreads to the arms, back, neck, or jaw, or if you have sweating, nausea, shortness of breath. This is an emergency. Don't wait to see if the pain will go away. Get medical help at once. Call 911 immediately. Do not drive herself to the hospital.  Your chest pain gets worse and does not go away with rest.  You have an attack of chest pain lasting longer than usual, despite rest and treatment with the medications your caregiver has prescribed  You awaken from sleep with chest pain or shortness of breath.  You feel faint or dizzy  You have chest pain not typical of your usual pain for which you originally saw your caregiver.  You must have a repeat evaluation within 24 hours for a recheck of your heart.  Please call your doctor this morning to schedule this appointment. If you do not have a family doctor, please see the list of doctors below.    Although this is easier said than done, please try to reduce the amount of stress at work or at home, as this may help as well.  Again, return to the ED for new or worsening symptoms as discussed.  RESOURCE GUIDE  Dental Problems  Patients with Medicaid: [facility] [phone]                                           [phone] [address] Phone:  [phone]                                                   Phone:  [phone]  If unable to pay or uninsured, contact:  [facility] or [facility]. to become qualified for the adult dental clinic.  Chronic Pain Problems Contact [facility]  [phone] Patients need to be referred by their primary care doctor.  Insufficient Money for Medicine Contact [employer]:  call "211" or [facility] 9516650814.  No Primary Care Doctor Call [facility]  [phone] Other agencies that provide inexpensive medical care    [facility]  [phone]    [facility]  [phone]    [facility]  [phone]    [facility]  [phone]    [facility]  [phone]    [facility]  [phone]  [facility]ological Services [facility]  [phone] [facility]  [phone] [facility]   800  [phone] (emergency services [phone])  Substance Abuse Resources Alcohol and Drug Services  [place] [phone] [facility] [phone] Residential & Outpatient Substance Abuse Program  [phone]  Abuse/Neglect [place] [phone] [place] [phone] (After Hours)  Emergency [place] [phone]  [place] [facility] [place] [phone] [place] [phone]  MRSA Hotline #:   (609) 028-6905    [facility] Dept. [address]      [place] Phone:  [phone]                                   Phone:  [phone]                 Phone:  [place] Phone:  [place] [phone] (After Hours)

## 2022-11-17 NOTE — ED Provider Notes (Signed)
[facility]-EMERGENCY DEPT Provider Note   CSN: [id number] Arrival date & time: 11/17/22  1156     History  Chief Complaint  Patient presents with   Chest Pain    Penny Burton is a 56 y.o. female with no significant cardiac history presenting to the ED today due to chest pain and shortness of breath.  Began 3 days ago, initially pain on the left side of the chest which was nonradiating.  The next day pain was felt more in the left neck area.  Today pain/discomfort return to the left central chest area with short episode of left hand "tingling" sensation.  Pain occurs with deep inspiration, however absent otherwise.  States this is never happened before.  No Hx of asthma or lung disease.  Works as an Neurosurgeon at [employer], and has reported increased stress over the last few weeks.  Recently finished taking Tamiflu last week on Thursday due to recent URI.  Denies N/V/D, abdominal pain, dizziness, urinary symptoms, or changes in bowel habits.  No known recent sick contacts.  The history is provided by the patient and medical records.  Chest Pain Associated symptoms: shortness of breath       Home Medications Prior to Admission medications   Medication Sig Start Date End Date Taking? Authorizing Provider  Ascorbic Acid (VITAMIN C) 1000 MG tablet Take 500 mg by mouth daily. 2 daily    [provider]  Cholecalciferol (VITAMIN D3 PO) Take by mouth daily.    [provider]  estradiol (ESTRACE) 0.1 MG/GM vaginal cream INSERT 2 GRAMS VAGINALLY EVERY NIGHT FOR 2 WEEKS THEN USE VAGINALLY 2 TIMES EVERY WEEK THEREAFTER 12/30/19   [provider]  Multiple Vitamins-Minerals (MULTIVITAMIN GUMMIES ADULT PO) Take by mouth daily.    [provider]      Allergies    Patient has no known allergies.    Review of Systems   Review of Systems  Respiratory:  Positive for shortness of breath.   Cardiovascular:  Positive  for chest pain.    Physical Exam Updated Vital Signs BP 124/87 (BP Location: Left Arm)   Pulse 73   Temp 98.9 F (37.2 C) (Oral)   Resp 16   Ht 5\' 2"  (1.575 m)   Wt 74.8 kg   SpO2 99%   BMI 30.18 kg/m  Physical Exam Vitals and nursing note reviewed.  Constitutional:      General: She is not in acute distress.    Appearance: She is well-developed. She is not ill-appearing, toxic-appearing or diaphoretic.  HENT:     Head: Normocephalic and atraumatic.  Eyes:     Conjunctiva/sclera: Conjunctivae normal.  Cardiovascular:     Rate and Rhythm: Normal rate and regular rhythm.     Pulses:          Radial pulses are 2+ on the right side and 2+ on the left side.     Heart sounds: No murmur heard. Pulmonary:     Effort: Pulmonary effort is normal. No tachypnea, accessory muscle usage or respiratory distress.     Breath sounds: Normal breath sounds. No stridor. No decreased breath sounds, wheezing, rhonchi or rales.     Comments: CTAB, equal chest rise, able to communicate without difficulty, without increased respiratory effort Chest:     Chest wall: Tenderness (Mild central-left chest wall tenderness) present. No mass, deformity or crepitus.  Abdominal:     Palpations: Abdomen is soft.     Tenderness:  There is no abdominal tenderness.  Musculoskeletal:        General: No swelling.     Cervical back: Neck supple.     Right lower leg: No tenderness. No edema.     Left lower leg: No tenderness. No edema.  Lymphadenopathy:     Cervical: No cervical adenopathy.  Skin:    General: Skin is warm and dry.     Capillary Refill: Capillary refill takes less than 2 seconds.     Coloration: Skin is not cyanotic or pale.  Neurological:     Mental Status: She is alert and oriented to person, place, and time.  Psychiatric:        Mood and Affect: Mood normal.     ED Results / Procedures / Treatments   Labs (all labs ordered are listed, but only abnormal results are displayed) Labs  Reviewed  BASIC METABOLIC PANEL - Abnormal; Notable for the following components:      Result Value   Glucose, Bld 101 (*)    All other components within normal limits  CBC  TROPONIN I (HIGH SENSITIVITY)  TROPONIN I (HIGH SENSITIVITY)    EKG EKG Interpretation  Date/Time:  Friday November 17 2022 12:09:41 EST Ventricular Rate:  73 PR Interval:  166 QRS Duration: 76 QT Interval:  382 QTC Calculation: 420 R Axis:   71 Text Interpretation: Normal sinus rhythm Normal ECG No previous ECGs available Confirmed by Jacalyn Lefevre [phone]) on 11/17/2022 3:34:55 PM  Radiology CT Chest Wo Contrast  Result Date: 11/17/2022 CLINICAL DATA:  Lung nodule EXAM: CT CHEST WITHOUT CONTRAST TECHNIQUE: Multidetector CT imaging of the chest was performed following the standard protocol without IV contrast. RADIATION DOSE REDUCTION: This exam was performed according to the departmental dose-optimization program which includes automated exposure control, adjustment of the mA and/or kV according to patient size and/or use of iterative reconstruction technique. COMPARISON:  None Available. FINDINGS: Cardiovascular: Normal heart size. No pericardial effusion. Normal caliber thoracic aorta with no atherosclerotic disease. Mediastinum/Nodes: Small hiatal hernia. Thyroid is unremarkable. No pathologically enlarged lymph nodes seen in the chest. Lungs/Pleura: Central airways are patent. No consolidation, pleural effusion or pneumothorax. Well-circumscribed nodule with central calcifications and macroscopic fat measuring 1.5 x 1.5 cm located on series 5 image 95. Upper Abdomen: No acute abnormality. Musculoskeletal: No chest wall mass or suspicious bone lesions identified. IMPRESSION: 1. 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 2. No acute findings in the chest. Electronically Signed   By: Allegra Lai M.D.   On: 11/17/2022 16:08   DG Chest 2  View  Result Date: 11/17/2022 CLINICAL DATA:  Chest pain EXAM: CHEST - 2 VIEW COMPARISON:  None Available. FINDINGS: Heart size and mediastinal contours are within normal limits. Solid pulmonary nodule of the right middle lobe. Both lungs are otherwise clear. The visualized skeletal structures are unremarkable. IMPRESSION: Solid pulmonary nodule of the right middle lobe. Chest CT is recommended for further evaluation. Electronically Signed   By: Allegra Lai M.D.   On: 11/17/2022 13:29    Procedures Procedures    Medications Ordered in ED Medications - No data to display  ED Course/ Medical Decision Making/ A&P           HEART Score: 1                Medical Decision Making Amount and/or Complexity of Data Reviewed Labs: ordered. Radiology: ordered.   56 y.o. female presents to  the ED for concern of Chest Pain     This involves an extensive number of treatment options, and is a complaint that carries with it a high risk of complications and morbidity.  The emergent differential diagnosis prior to evaluation includes, but is not limited to: ACS, pneumonia, pneumothorax, pulmonary embolism,pericarditis/myocarditis, GERD, PUD, musculoskeletal, costochondritis, anxiousness  This is not an exhaustive differential.   Past Medical History / Co-morbidities / Social History: Hx of post-menopause. Social Determinants of Health include: None  Additional History:  None  Lab Tests: I ordered, and personally interpreted labs.  The pertinent results include:   CBC without elevated WBC, leukocytosis, or evidence of anemia BMP unremarkable Troponin less than 2  Imaging Studies: I ordered imaging studies including CXR, CT chest.   I independently visualized and interpreted imaging.  CXR indicates a solid pulmonary nodule of the right middle lobe, recommended CT chest for further evaluation.  Further evaluation with CT chest indicates a benign pulmonary nodule, likely hamartoma.  No  evidence of rib fracture, pneumonia, or other acute findings. I agree with the radiologist interpretation.  Cardiac Monitoring: The patient was maintained on a cardiac monitor.  I personally viewed and interpreted the cardiac monitored which showed an underlying rhythm of: NSR I personally ordered and interpreted EKG 12 lead findings, which showed: NSR with evidence of infarction or ischemia  ED Course / Critical Interventions: Pt well-appearing on exam.  Nontoxic, nonseptic appearing in NAD.  Presenting to the ED due to 2 to 3 days of waxing and waning chest pain and shortness of breath, both worsened with deep inspiration.  Pain does not radiate.  Patient denies chest pain or shortness of breath at this time, stating as long as she takes regular or shallow breaths she does not feel the symptoms.  No significant cardiac hx.  No Hx of asthma or COPD.  This is never happened before per patient.  Also with increased stress at work over the last few weeks. HEART score of 1 due to age, low risk.  Considered ASA and nitroglycerin, however do not seem appropriate at this time.  Well's score 0, low probability.  Labs unremarkable.  No cough, no leukocytosis, no fevers.  Unlikely pneumothorax or pneumonia.  Unlikely pericarditis/myocarditis, GERD, PUD, as does not fit clinical picture.  Chest pain not exertional, appears pleuritic in nature.  CXR with findings suspicious for pulmonary nodule, recommended further evaluation with CT.  No evidence of pleural effusion or pulmonary edema on CXR.  Not reproducible with ROM, less likely MSK related.  Unlikely dissection, no pulse deficit, no tearing chest pain, no neurologic complaints.  EKG findings indicate without evidence of acute ischemic changes, abnormal intervals, or dysrhythmia.  Troponins negative.  Presentation provides low suspicion for ACS, however plan to proceed with further imaging to evaluate pulmonary nodule. Upon reevaluation, status unchanged.   Discussed CXR findings and recommendations for CT.  Patient in agreement.  Patient reports she has been postmenopausal for at least 5 years, has elected to decline pregnancy testing prior to further imaging and understands associated risks.  CT pending. CT indicates a likely hamartoma without any other acute findings, likely benign.  Overall, I am uncertain the exact etiology of the patient's symptoms.  However, I do not believe she is currently experiencing a medical, surgical, or psychiatric emergency.  Recommend close follow-up with PCP for reevaluation and continued medical management.  Also recommend finding ways to destress over the next few weeks and to monitor for improvement.  Strict return precautions discussed.  Patient reports satisfaction with today's encounter.  Patient in NAD and good condition at time of discharge.  Disposition: After consideration the patient's encounter today, I do not feel today's workup suggests an emergent condition requiring admission or immediate intervention beyond what has been performed at this time.  Safe for discharge; instructed to return immediately for worsening symptoms, change in symptoms or any other concerns.  I have reviewed the patients home medicines and have made adjustments as needed.  Discussed course of treatment with the patient, whom demonstrated understanding.  Patient in agreement and has no further questions.     I discussed this case with attending, Dr. Particia Nearing, who agreed with the proposed treatment course and cosigned this note.  Attending physician stated agreement with plan or made changes to plan which were implemented.     This chart was dictated using voice recognition software.  Despite best efforts to proofread, errors can occur which can change the documentation meaning.         Final Clinical Impression(s) / ED Diagnoses Final diagnoses:  Nonspecific chest pain  Shortness of breath  Pulmonary nodule, right    Rx /  DC Orders ED Discharge Orders     None         Sandrea Hammond 11/17/22 1630    Jacalyn Lefevre, MD 11/17/22 1655

## 2022-11-17 NOTE — ED Triage Notes (Signed)
Pt reports left sided chest pain and sob when taking a deep breath since last night

## 2022-11-17 NOTE — ED Notes (Signed)
I called patient to recheck vitals and no one responded
